# Patient Record
Sex: Female | Born: 1945 | Race: Black or African American | Hispanic: No | Smoking: Never smoker
Health system: Southern US, Community
[De-identification: ages and names within clinical notes are randomized; demographics above are authoritative.]

## PROBLEM LIST (undated history)

## (undated) DIAGNOSIS — I10 Essential (primary) hypertension: Secondary | ICD-10-CM

## (undated) DIAGNOSIS — M797 Fibromyalgia: Secondary | ICD-10-CM

## (undated) DIAGNOSIS — E119 Type 2 diabetes mellitus without complications: Secondary | ICD-10-CM

## (undated) HISTORY — DX: Essential (primary) hypertension: I10

## (undated) HISTORY — DX: Fibromyalgia: M79.7

## (undated) HISTORY — DX: Type 2 diabetes mellitus without complications: E11.9

---

## 1998-01-17 ENCOUNTER — Ambulatory Visit (HOSPITAL_COMMUNITY): Admission: RE | Admit: 1998-01-17 | Discharge: 1998-01-17 | Payer: Self-pay | Admitting: Rheumatology

## 1998-05-02 ENCOUNTER — Encounter: Payer: Self-pay | Admitting: Endocrinology

## 1998-05-02 ENCOUNTER — Ambulatory Visit (HOSPITAL_COMMUNITY): Admission: RE | Admit: 1998-05-02 | Discharge: 1998-05-02 | Payer: Self-pay | Admitting: Endocrinology

## 1999-06-18 ENCOUNTER — Other Ambulatory Visit: Admission: RE | Admit: 1999-06-18 | Discharge: 1999-06-18 | Payer: Self-pay | Admitting: *Deleted

## 2000-08-16 ENCOUNTER — Other Ambulatory Visit: Admission: RE | Admit: 2000-08-16 | Discharge: 2000-08-16 | Payer: Self-pay | Admitting: *Deleted

## 2000-08-17 ENCOUNTER — Encounter: Payer: Self-pay | Admitting: Emergency Medicine

## 2000-08-17 ENCOUNTER — Emergency Department (HOSPITAL_COMMUNITY): Admission: EM | Admit: 2000-08-17 | Discharge: 2000-08-17 | Payer: Self-pay | Admitting: Emergency Medicine

## 2000-09-02 ENCOUNTER — Encounter: Admission: RE | Admit: 2000-09-02 | Discharge: 2000-10-03 | Payer: Self-pay | Admitting: Rheumatology

## 2001-09-07 ENCOUNTER — Ambulatory Visit (HOSPITAL_COMMUNITY): Admission: RE | Admit: 2001-09-07 | Discharge: 2001-09-07 | Payer: Self-pay | Admitting: Endocrinology

## 2001-09-07 ENCOUNTER — Encounter: Payer: Self-pay | Admitting: Endocrinology

## 2003-07-01 ENCOUNTER — Emergency Department (HOSPITAL_COMMUNITY): Admission: EM | Admit: 2003-07-01 | Discharge: 2003-07-02 | Payer: Self-pay | Admitting: Emergency Medicine

## 2003-07-18 ENCOUNTER — Ambulatory Visit (HOSPITAL_COMMUNITY): Admission: RE | Admit: 2003-07-18 | Discharge: 2003-07-18 | Payer: Self-pay | Admitting: Internal Medicine

## 2003-08-14 ENCOUNTER — Encounter: Admission: RE | Admit: 2003-08-14 | Discharge: 2003-11-12 | Payer: Self-pay | Admitting: Neurological Surgery

## 2003-10-30 ENCOUNTER — Other Ambulatory Visit: Admission: RE | Admit: 2003-10-30 | Discharge: 2003-10-30 | Payer: Self-pay | Admitting: Obstetrics & Gynecology

## 2004-07-27 ENCOUNTER — Ambulatory Visit: Payer: Self-pay | Admitting: Internal Medicine

## 2004-07-29 ENCOUNTER — Ambulatory Visit: Payer: Self-pay | Admitting: Internal Medicine

## 2005-09-27 ENCOUNTER — Ambulatory Visit: Payer: Self-pay | Admitting: Internal Medicine

## 2005-10-13 ENCOUNTER — Ambulatory Visit: Payer: Self-pay | Admitting: Internal Medicine

## 2005-10-21 ENCOUNTER — Ambulatory Visit: Payer: Self-pay | Admitting: Internal Medicine

## 2005-12-30 ENCOUNTER — Ambulatory Visit: Payer: Self-pay | Admitting: Internal Medicine

## 2006-04-08 ENCOUNTER — Encounter
Admission: RE | Admit: 2006-04-08 | Discharge: 2006-07-07 | Payer: Self-pay | Admitting: Physical Medicine & Rehabilitation

## 2006-04-08 ENCOUNTER — Ambulatory Visit: Payer: Self-pay | Admitting: Physical Medicine & Rehabilitation

## 2006-04-12 ENCOUNTER — Ambulatory Visit: Payer: Self-pay | Admitting: Internal Medicine

## 2006-04-18 ENCOUNTER — Encounter
Admission: RE | Admit: 2006-04-18 | Discharge: 2006-07-07 | Payer: Self-pay | Admitting: Physical Medicine and Rehabilitation

## 2006-05-13 ENCOUNTER — Ambulatory Visit: Payer: Self-pay | Admitting: Internal Medicine

## 2006-06-03 ENCOUNTER — Ambulatory Visit: Payer: Self-pay | Admitting: Physical Medicine & Rehabilitation

## 2006-07-08 ENCOUNTER — Ambulatory Visit: Payer: Self-pay | Admitting: Physical Medicine & Rehabilitation

## 2006-07-08 ENCOUNTER — Encounter
Admission: RE | Admit: 2006-07-08 | Discharge: 2006-10-06 | Payer: Self-pay | Admitting: Physical Medicine & Rehabilitation

## 2006-08-25 ENCOUNTER — Ambulatory Visit: Payer: Self-pay | Admitting: Internal Medicine

## 2006-09-01 ENCOUNTER — Ambulatory Visit: Payer: Self-pay | Admitting: Physical Medicine & Rehabilitation

## 2006-09-20 ENCOUNTER — Encounter (INDEPENDENT_AMBULATORY_CARE_PROVIDER_SITE_OTHER): Payer: Self-pay | Admitting: *Deleted

## 2006-09-20 ENCOUNTER — Other Ambulatory Visit: Admission: RE | Admit: 2006-09-20 | Discharge: 2006-09-20 | Payer: Self-pay | Admitting: Internal Medicine

## 2006-09-20 ENCOUNTER — Ambulatory Visit: Payer: Self-pay | Admitting: Internal Medicine

## 2006-11-22 ENCOUNTER — Encounter
Admission: RE | Admit: 2006-11-22 | Discharge: 2007-02-20 | Payer: Self-pay | Admitting: Physical Medicine & Rehabilitation

## 2006-11-22 ENCOUNTER — Ambulatory Visit: Payer: Self-pay | Admitting: Physical Medicine & Rehabilitation

## 2007-01-11 ENCOUNTER — Ambulatory Visit: Payer: Self-pay | Admitting: Internal Medicine

## 2007-01-18 ENCOUNTER — Ambulatory Visit: Payer: Self-pay | Admitting: Internal Medicine

## 2007-02-08 ENCOUNTER — Encounter: Payer: Self-pay | Admitting: Internal Medicine

## 2007-02-08 ENCOUNTER — Ambulatory Visit: Payer: Self-pay | Admitting: Internal Medicine

## 2007-02-23 ENCOUNTER — Encounter
Admission: RE | Admit: 2007-02-23 | Discharge: 2007-05-08 | Payer: Self-pay | Admitting: Physical Medicine & Rehabilitation

## 2007-05-02 ENCOUNTER — Ambulatory Visit: Payer: Self-pay | Admitting: Physical Medicine & Rehabilitation

## 2007-11-22 ENCOUNTER — Ambulatory Visit (HOSPITAL_COMMUNITY): Admission: RE | Admit: 2007-11-22 | Discharge: 2007-11-22 | Payer: Self-pay | Admitting: Obstetrics & Gynecology

## 2008-04-29 ENCOUNTER — Ambulatory Visit: Payer: Self-pay | Admitting: *Deleted

## 2008-04-29 ENCOUNTER — Ambulatory Visit: Payer: Self-pay | Admitting: Internal Medicine

## 2008-05-23 ENCOUNTER — Ambulatory Visit: Payer: Self-pay | Admitting: Internal Medicine

## 2008-11-09 ENCOUNTER — Emergency Department (HOSPITAL_COMMUNITY): Admission: EM | Admit: 2008-11-09 | Discharge: 2008-11-09 | Payer: Self-pay | Admitting: Emergency Medicine

## 2010-06-28 ENCOUNTER — Encounter: Payer: Self-pay | Admitting: Family Medicine

## 2010-10-20 NOTE — Assessment & Plan Note (Signed)
Joanna Wolf is back regarding her fibromyalgia.  Her fibro has been under  fair control as has her shoulders, but her knees have really been acting  up lately.  She has worked on exercise and weight loss but really has  not made a lot of gains as far as her weight is concerned.  Her knees  are bothering her with walking and standing and particularly with stair-  climbing.  Pain is more medially in both knees.  She describes it as  sharp aching and constant.   The patient remains on Lyrica 150 mg t.i.d., Cymbalta 60 mg daily.  Pain  interferes with general activity, relations with others, and quality of  life on a moderate to severe level.   REVIEW OF SYSTEMS:  The patient reports constipation, occasional  abdominal pain.  Other pertinent positives listed above, and full review  is in the health and history section of the chart.   SOCIAL HISTORY:  The patient is divorced and living alone.  She is going  to stay with her mother over the summer.   PHYSICAL EXAMINATION:  VITAL SIGNS:  Blood pressure is 127/80, pulse 58,  respiratory rate 18.  She is saturating 97% on room air.  GENERAL:  The patient is pleasant, in no acute distress.  She is alert  and oriented x3.  She remains obese.  She is antalgic in both legs.  She  has bilateral valgus deformities in both knees.  Her shoulder range of  motion is fair.  She has tenderness in the neck and shoulders, back and  hips once again today.  Cognitively, she is intact.  HEART:  Regular.  LUNGS:  Clear.  ABDOMEN:  Soft, nontender.  EXTREMITIES:  Both knees are notable for some peripatellar swelling and  amount of crepitus with range of motion.  Meniscal maneuvers were  positive in both knees.   ASSESSMENT:  1. Fibromyalgia.  2. Cervical spondylosis with degenerative disk disease at C4-C5.  3. Obesity.  4. Left rotator cuff syndrome.  5. 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  6. Insulin-requiring diabetes.  7. Osteoarthritis of  bilateral knees.   PLAN:  1. After informed consent, we injected via the medial approach both      knees with 40 mg of Kenalog and 3 mL of 1% lidocaine.  The patient      tolerated without difficulty.  We discussed temporary increases in      her CBGs which she will need to be aware of.  2. Encouraged exercise and weight loss.  She may benefit from knee      bracing.  Consider Synvisc in the future.  3. Continue medications as above.  Recommend DHEA supplementation.  4. I will see her back in about 3 months' time if not sooner.      Ranelle Oyster, M.D.  Electronically Signed     ZTS/MedQ  D:  11/28/2006 10:20:44  T:  11/28/2006 11:45:59  Job #:  [id number]   cc:   Tresa Endo L. Philipp Deputy, M.D.  Fax: [phone]

## 2010-10-20 NOTE — Assessment & Plan Note (Signed)
Joanna Wolf is back regarding her fibromyalgia and knee pain. We injected her  knees in June and she had good results for 2-3 months. She has been  trying to focus on her weight loss recently, but still has not made a  lot of headway there. She rates her pain a 9/10 and has diffuse pain,  but pain focuses more so on the knees. She describes the pain as  stabbing, aching and sharp. The pain is associated with general  activities, relations with others and enjoyment of life on a moderate to  severe level.   REVIEW OF SYSTEMS:  Is notable for trouble walking, bowel control  problems, night sweats, constipation. Other pertinent positives listed  above and full review is in the written Health and History section of  the chart.   SOCIAL HISTORY:  The patient is divorced and living alone.   PHYSICAL EXAMINATION:  Blood pressure 135/77, pulse 79, respiratory rate  is 18. She is satting 98% on room air. The patient is generally  pleasant; alert and oriented x3. She remains obese. She has been  bilateral valgus deformities at the knees. She is antalgic with her gait  bilaterally. She has fair shoulder range of motion. She has multiple  tender points. Knees are generally more tender particularly at the  medial aspects with palpation. She had no obvious crepitus today. No  ligamentous laxity was noted.  HEART: Regular.  CHEST: Clear.  Meniscal maneuvers were positive in both knees.   ASSESSMENT:  1. Fibromyalgia.  2. Cervical spondylosis and degenerative disc disease particularly at      C4-C5.  3. Obesity.  4. Left rotator cuff syndrome.  5. Bilateral osteoarthritis of the knees.  6. Myofascial dysfunction of neck and shoulders.  7. Diabetes.   PLAN:  1. After informed consent, we injected both knees via the lateral      approach this time with 40 mg of Kenalog and 3 cc of 1% lidocaine.      The patient tolerated well.  2. Recommended over-the-counter glucosamine with chondroitin.  3.  Continue exercise and weight loss attempts.  4. Continue Cymbalta and Lyrica as prescribed.  5. I will see her back in three months.      Ranelle Oyster, M.D.  Electronically Signed     ZTS/MedQ  D:  05/08/2007 15:46:14  T:  05/08/2007 20:01:27  Job #:  [id number]   cc:   Tresa Endo L. Philipp Deputy, M.D.  Fax: [phone]

## 2014-04-23 ENCOUNTER — Ambulatory Visit (INDEPENDENT_AMBULATORY_CARE_PROVIDER_SITE_OTHER): Payer: PRIVATE HEALTH INSURANCE | Admitting: Family Medicine

## 2014-04-23 VITALS — BP 146/72 | HR 114 | Temp 98.4°F | Resp 20 | Ht 60.0 in | Wt 226.8 lb

## 2014-04-23 DIAGNOSIS — E119 Type 2 diabetes mellitus without complications: Secondary | ICD-10-CM

## 2014-04-23 DIAGNOSIS — L500 Allergic urticaria: Secondary | ICD-10-CM

## 2014-04-23 DIAGNOSIS — T783XXA Angioneurotic edema, initial encounter: Secondary | ICD-10-CM

## 2014-04-23 MED ORDER — RANITIDINE HCL 150 MG PO TABS
150.0000 mg | ORAL_TABLET | Freq: Two times a day (BID) | ORAL | Status: AC
  Administered 2014-04-23: 150 mg via ORAL

## 2014-04-23 MED ORDER — METHYLPREDNISOLONE SODIUM SUCC 125 MG IJ SOLR
125.0000 mg | Freq: Once | INTRAMUSCULAR | Status: AC
  Administered 2014-04-23: 125 mg via INTRAMUSCULAR

## 2014-04-23 MED ORDER — DIPHENHYDRAMINE HCL 50 MG/ML IJ SOLN
25.0000 mg | Freq: Once | INTRAMUSCULAR | Status: AC
  Administered 2014-04-23: 25 mg via INTRAMUSCULAR

## 2014-04-23 MED ORDER — PREDNISONE 10 MG PO TABS: ORAL_TABLET | ORAL | Status: AC

## 2014-04-23 MED ORDER — CETIRIZINE HCL 10 MG PO TABS
5.0000 mg | ORAL_TABLET | Freq: Every day | ORAL | Status: AC
  Administered 2014-04-23: 5 mg via ORAL

## 2014-04-23 NOTE — Addendum Note (Signed)
Addended by: Alonnah Lampkins H on: 04/23/2014 09:24 PM   Modules accepted: Level of Service

## 2014-04-23 NOTE — Progress Notes (Signed)
Subjective: 68 year old lady who is here complaining of itching. She went to her exercise class early today and worked out. She got sweaty. She started itching all over. She said it calmed down a little bit there came back worse with breaking out with hives on her arms and legs and trunk. She feels like her lip is a little swollen. The tongue feels a little tingly. No shortness of breath. Has never had hives before. She is diabetic but her sugars run good on the medication, in the low 100 range.  Objective: Significant urticaria on her extremities, less on her trunk. She has a little swelling of the left lower lip. The tongue appears normal as does the throat. Neck supple without nodes. Chest clear. Heart regular without murmurs.  Assessment: Urticaria and early angioedema diabetes  Plan: Solu-Medrol 125 mg IM Zyrtec1 now Ranitidine150 mg now Benadryl 25 mg IM Detailed instructions written out and discussed with her son. We do not know the cause of this. She is to go to the emergency room if in all worse, or return here tomorrow for can be of assistance.

## 2014-04-23 NOTE — Patient Instructions (Addendum)
You will have received an injection of 125 mg of Solumedrol and Benadryl 25 mg.  If you are getting at all worse over next few hours, especially if lips or tongue are getting worse, GO TO THE EMERGENCY ROOM OR CALL 911  Starting tomorrow Take Prednisone as directed  Take ranitidine (Zantac) 150 mg twice daily  Take cetirizine (Zyrtec) one pill once or twice daily for allergy and itching  If worse at anytime either return here or go to the emergency room if necessary during hours that we are closed  Your sugar will go up with the Solumedrol injection. Be careful on avoiding sugars and starches (potatoes, rice, bread, pasta)Patient is asked to monitor BP at home or work, several times per month and return with written values at next office visit. Your blood sugars. They will go up for a few days.  Hives Hives are itchy, red, swollen areas of the skin. They can vary in size and location on your body. Hives can come and go for hours or several days (acute hives) or for several weeks (chronic hives). Hives do not spread from person to person (noncontagious). They may get worse with scratching, exercise, and emotional stress. CAUSES   Allergic reaction to food, additives, or drugs.  Infections, including the common cold.  Illness, such as vasculitis, lupus, or thyroid disease.  Exposure to sunlight, heat, or cold.  Exercise.  Stress.  Contact with chemicals. SYMPTOMS   Red or white swollen patches on the skin. The patches may change size, shape, and location quickly and repeatedly.  Itching.  Swelling of the hands, feet, and face. This may occur if hives develop deeper in the skin. DIAGNOSIS  Your caregiver can usually tell what is wrong by performing a physical exam. Skin or blood tests may also be done to determine the cause of your hives. In some cases, the cause cannot be determined. TREATMENT  Mild cases usually get better with medicines such as antihistamines. Severe cases  may require an emergency epinephrine injection. If the cause of your hives is known, treatment includes avoiding that trigger.  HOME CARE INSTRUCTIONS   Avoid causes that trigger your hives.  Take antihistamines as directed by your caregiver to reduce the severity of your hives. Non-sedating or low-sedating antihistamines are usually recommended. Do not drive while taking an antihistamine.  Take any other medicines prescribed for itching as directed by your caregiver.  Wear loose-fitting clothing.  Keep all follow-up appointments as directed by your caregiver. SEEK MEDICAL CARE IF:   You have persistent or severe itching that is not relieved with medicine.  You have painful or swollen joints. SEEK IMMEDIATE MEDICAL CARE IF:   You have a fever.  Your tongue or lips are swollen.  You have trouble breathing or swallowing.  You feel tightness in the throat or chest.  You have abdominal pain. These problems may be the first sign of a life-threatening allergic reaction. Call your local emergency services (911 in [place] MAKE SURE YOU:   Understand these instructions.  Will watch your condition.  Will get help right away if you are not doing well or get worse. Document Released: 05/24/2005 Document Revised: 05/29/2013 Document Reviewed: 08/17/2011 [facility] Patient Information 2015 [employer], [place]. This information is not intended to replace advice given to you by your health care provider. Make sure you discuss any questions you have with your health care provider.

## 2016-03-26 ENCOUNTER — Other Ambulatory Visit: Payer: Self-pay | Admitting: Family Medicine

## 2016-03-26 DIAGNOSIS — R1012 Left upper quadrant pain: Secondary | ICD-10-CM

## 2016-04-07 ENCOUNTER — Inpatient Hospital Stay: Admission: RE | Admit: 2016-04-07 | Payer: Self-pay | Source: Ambulatory Visit

## 2016-05-19 ENCOUNTER — Other Ambulatory Visit: Payer: Self-pay

## 2016-05-19 ENCOUNTER — Other Ambulatory Visit: Payer: Self-pay | Admitting: Family Medicine

## 2016-05-19 DIAGNOSIS — R1012 Left upper quadrant pain: Secondary | ICD-10-CM

## 2016-05-24 ENCOUNTER — Inpatient Hospital Stay: Admission: RE | Admit: 2016-05-24 | Payer: Self-pay | Source: Ambulatory Visit

## 2020-05-13 ENCOUNTER — Ambulatory Visit: Payer: Medicare Other | Admitting: Podiatry

## 2020-07-22 DIAGNOSIS — E785 Hyperlipidemia, unspecified: Secondary | ICD-10-CM | POA: Diagnosis not present

## 2020-07-22 DIAGNOSIS — K219 Gastro-esophageal reflux disease without esophagitis: Secondary | ICD-10-CM | POA: Diagnosis not present

## 2020-07-22 DIAGNOSIS — E119 Type 2 diabetes mellitus without complications: Secondary | ICD-10-CM | POA: Diagnosis not present

## 2020-07-22 DIAGNOSIS — D582 Other hemoglobinopathies: Secondary | ICD-10-CM | POA: Diagnosis not present

## 2020-07-22 DIAGNOSIS — I10 Essential (primary) hypertension: Secondary | ICD-10-CM | POA: Diagnosis not present

## 2020-07-22 DIAGNOSIS — E1165 Type 2 diabetes mellitus with hyperglycemia: Secondary | ICD-10-CM | POA: Diagnosis not present

## 2020-07-22 DIAGNOSIS — M159 Polyosteoarthritis, unspecified: Secondary | ICD-10-CM | POA: Diagnosis not present

## 2020-07-24 DIAGNOSIS — E1165 Type 2 diabetes mellitus with hyperglycemia: Secondary | ICD-10-CM | POA: Diagnosis not present

## 2020-07-24 DIAGNOSIS — D582 Other hemoglobinopathies: Secondary | ICD-10-CM | POA: Diagnosis not present

## 2020-07-24 DIAGNOSIS — Z794 Long term (current) use of insulin: Secondary | ICD-10-CM | POA: Diagnosis not present

## 2020-07-24 DIAGNOSIS — I10 Essential (primary) hypertension: Secondary | ICD-10-CM | POA: Diagnosis not present

## 2020-09-01 DIAGNOSIS — I10 Essential (primary) hypertension: Secondary | ICD-10-CM | POA: Diagnosis not present

## 2020-09-01 DIAGNOSIS — M159 Polyosteoarthritis, unspecified: Secondary | ICD-10-CM | POA: Diagnosis not present

## 2020-09-01 DIAGNOSIS — K219 Gastro-esophageal reflux disease without esophagitis: Secondary | ICD-10-CM | POA: Diagnosis not present

## 2020-09-01 DIAGNOSIS — D582 Other hemoglobinopathies: Secondary | ICD-10-CM | POA: Diagnosis not present

## 2020-09-01 DIAGNOSIS — E119 Type 2 diabetes mellitus without complications: Secondary | ICD-10-CM | POA: Diagnosis not present

## 2020-09-01 DIAGNOSIS — E1165 Type 2 diabetes mellitus with hyperglycemia: Secondary | ICD-10-CM | POA: Diagnosis not present

## 2020-09-01 DIAGNOSIS — E785 Hyperlipidemia, unspecified: Secondary | ICD-10-CM | POA: Diagnosis not present

## 2020-09-19 DIAGNOSIS — K219 Gastro-esophageal reflux disease without esophagitis: Secondary | ICD-10-CM | POA: Diagnosis not present

## 2020-09-19 DIAGNOSIS — M159 Polyosteoarthritis, unspecified: Secondary | ICD-10-CM | POA: Diagnosis not present

## 2020-09-19 DIAGNOSIS — E119 Type 2 diabetes mellitus without complications: Secondary | ICD-10-CM | POA: Diagnosis not present

## 2020-09-19 DIAGNOSIS — I10 Essential (primary) hypertension: Secondary | ICD-10-CM | POA: Diagnosis not present

## 2020-09-19 DIAGNOSIS — D582 Other hemoglobinopathies: Secondary | ICD-10-CM | POA: Diagnosis not present

## 2020-09-19 DIAGNOSIS — E785 Hyperlipidemia, unspecified: Secondary | ICD-10-CM | POA: Diagnosis not present

## 2020-09-19 DIAGNOSIS — E1165 Type 2 diabetes mellitus with hyperglycemia: Secondary | ICD-10-CM | POA: Diagnosis not present

## 2020-09-24 DIAGNOSIS — R319 Hematuria, unspecified: Secondary | ICD-10-CM | POA: Diagnosis not present

## 2020-10-11 ENCOUNTER — Encounter (HOSPITAL_COMMUNITY): Payer: Self-pay

## 2020-10-11 ENCOUNTER — Emergency Department (HOSPITAL_COMMUNITY)
Admission: EM | Admit: 2020-10-11 | Discharge: 2020-10-11 | Disposition: A | Discharge status: Home / Self Care | Payer: Medicare Other | Source: Home / Self Care | Attending: Emergency Medicine | Admitting: Emergency Medicine

## 2020-10-11 ENCOUNTER — Emergency Department (HOSPITAL_COMMUNITY)
Admission: EM | Admit: 2020-10-11 | Discharge: 2020-10-11 | Disposition: A | Discharge status: Home / Self Care | Payer: Medicare Other | Attending: Emergency Medicine | Admitting: Emergency Medicine

## 2020-10-11 ENCOUNTER — Emergency Department (HOSPITAL_COMMUNITY): Payer: Medicare Other

## 2020-10-11 ENCOUNTER — Other Ambulatory Visit: Payer: Self-pay

## 2020-10-11 DIAGNOSIS — R609 Edema, unspecified: Secondary | ICD-10-CM | POA: Insufficient documentation

## 2020-10-11 DIAGNOSIS — Z79899 Other long term (current) drug therapy: Secondary | ICD-10-CM | POA: Insufficient documentation

## 2020-10-11 DIAGNOSIS — Z7984 Long term (current) use of oral hypoglycemic drugs: Secondary | ICD-10-CM | POA: Insufficient documentation

## 2020-10-11 DIAGNOSIS — R9389 Abnormal findings on diagnostic imaging of other specified body structures: Secondary | ICD-10-CM | POA: Diagnosis not present

## 2020-10-11 DIAGNOSIS — N950 Postmenopausal bleeding: Secondary | ICD-10-CM | POA: Insufficient documentation

## 2020-10-11 DIAGNOSIS — I10 Essential (primary) hypertension: Secondary | ICD-10-CM

## 2020-10-11 DIAGNOSIS — E119 Type 2 diabetes mellitus without complications: Secondary | ICD-10-CM | POA: Insufficient documentation

## 2020-10-11 DIAGNOSIS — E1165 Type 2 diabetes mellitus with hyperglycemia: Secondary | ICD-10-CM | POA: Insufficient documentation

## 2020-10-11 DIAGNOSIS — N939 Abnormal uterine and vaginal bleeding, unspecified: Secondary | ICD-10-CM | POA: Insufficient documentation

## 2020-10-11 DIAGNOSIS — R739 Hyperglycemia, unspecified: Secondary | ICD-10-CM

## 2020-10-11 LAB — COMPREHENSIVE METABOLIC PANEL
ALT: 21 U/L (ref 0–44)
AST: 27 U/L (ref 15–41)
Albumin: 4 g/dL (ref 3.5–5.0)
Alkaline Phosphatase: 67 U/L (ref 38–126)
Anion gap: 13 (ref 5–15)
BUN: 12 mg/dL (ref 8–23)
CO2: 23 mmol/L (ref 22–32)
Calcium: 8.9 mg/dL (ref 8.9–10.3)
Chloride: 101 mmol/L (ref 98–111)
Creatinine, Ser: 0.94 mg/dL (ref 0.44–1.00)
GFR, Estimated: >60 mL/min (ref >60)
Glucose, Bld: 279 mg/dL — ABNORMAL HIGH (ref 70–99)
Potassium: 3.5 mmol/L (ref 3.5–5.1)
Sodium: 137 mmol/L (ref 135–145)
Total Bilirubin: 0.7 mg/dL (ref 0.3–1.2)
Total Protein: 7.4 g/dL (ref 6.5–8.1)

## 2020-10-11 LAB — CBC WITH DIFFERENTIAL/PLATELET
Abs Immature Granulocytes: 0.02 10*3/uL (ref 0.00–0.07)
Basophils Absolute: 0 10*3/uL (ref 0.0–0.1)
Basophils Relative: 0 %
Eosinophils Absolute: 0 10*3/uL (ref 0.0–0.5)
Eosinophils Relative: 0 %
HCT: 37 % (ref 36.0–46.0)
Hemoglobin: 12.2 g/dL (ref 12.0–15.0)
Immature Granulocytes: 0 %
Lymphocytes Relative: 21 %
Lymphs Abs: 1.7 10*3/uL (ref 0.7–4.0)
MCH: 24.7 pg — ABNORMAL LOW (ref 26.0–34.0)
MCHC: 33 g/dL (ref 30.0–36.0)
MCV: 75.1 fL — ABNORMAL LOW (ref 80.0–100.0)
Monocytes Absolute: 0.6 10*3/uL (ref 0.1–1.0)
Monocytes Relative: 7 %
Neutro Abs: 6 10*3/uL (ref 1.7–7.7)
Neutrophils Relative %: 72 %
Platelets: 215 10*3/uL (ref 150–400)
RBC: 4.93 MIL/uL (ref 3.87–5.11)
RDW: 15.9 % — ABNORMAL HIGH (ref 11.5–15.5)
WBC: 8.4 10*3/uL (ref 4.0–10.5)
nRBC: 0 % (ref 0.0–0.2)

## 2020-10-11 LAB — URINALYSIS, ROUTINE W REFLEX MICROSCOPIC
Bilirubin Urine: NEGATIVE
Glucose, UA: 150 mg/dL — AB
Ketones, ur: 5 mg/dL — AB
Leukocytes,Ua: NEGATIVE
Nitrite: NEGATIVE
Protein, ur: 30 mg/dL — AB
RBC / HPF: >50 RBC/hpf — ABNORMAL HIGH (ref 0–5)
Specific Gravity, Urine: 1.011 (ref 1.005–1.030)
pH: 7 (ref 5.0–8.0)

## 2020-10-11 LAB — CBC
HCT: 36.2 % (ref 36.0–46.0)
Hemoglobin: 11.8 g/dL — ABNORMAL LOW (ref 12.0–15.0)
MCH: 24.7 pg — ABNORMAL LOW (ref 26.0–34.0)
MCHC: 32.6 g/dL (ref 30.0–36.0)
MCV: 75.9 fL — ABNORMAL LOW (ref 80.0–100.0)
Platelets: 217 10*3/uL (ref 150–400)
RBC: 4.77 MIL/uL (ref 3.87–5.11)
RDW: 15.8 % — ABNORMAL HIGH (ref 11.5–15.5)
WBC: 9.8 10*3/uL (ref 4.0–10.5)
nRBC: 0 % (ref 0.0–0.2)

## 2020-10-11 LAB — WET PREP, GENITAL
Clue Cells Wet Prep HPF POC: NONE SEEN
Sperm: NONE SEEN
Trich, Wet Prep: NONE SEEN
Yeast Wet Prep HPF POC: NONE SEEN

## 2020-10-11 LAB — PROTIME-INR
INR: 1 (ref 0.8–1.2)
Prothrombin Time: 12.8 seconds (ref 11.4–15.2)

## 2020-10-11 LAB — CBG MONITORING, ED: Glucose-Capillary: 261 mg/dL — ABNORMAL HIGH (ref 70–99)

## 2020-10-11 MED ORDER — MEGESTROL ACETATE 40 MG PO TABS: 40.0000 mg | ORAL_TABLET | Freq: Two times a day (BID) | ORAL | 0 refills | Status: AC

## 2020-10-11 NOTE — Discharge Instructions (Signed)
Thank you for allowing [place] to take care of you today, we hope you feel better soon.  Please call the women's outpatient clinic to schedule very close follow-up. Start taking Megace 40 mg twice daily to help with your vaginal bleeding. You need to return to the emergency department if you have severe symptoms of your anemia such as lightheadedness, passout, chest pain, or shortness of breath.  Also come back for any other emergent medical conditions.  Thank you again, Dr. Marylouise Stacks

## 2020-10-11 NOTE — ED Notes (Addendum)
Patients son dropped off insulin  medicine to patient  RN notified and will instruct patient when and if to take

## 2020-10-11 NOTE — ED Notes (Signed)
Family at bedside. 

## 2020-10-11 NOTE — ED Notes (Signed)
Patient appears in no acute distress at this time. Patient does have dried blood on right leg and sock from earlier clotting. Denies pain, cramping or any other symptoms. Reports that she had to change her depends 4 times today due to excessive blood.

## 2020-10-11 NOTE — ED Provider Notes (Signed)
[facility] EMERGENCY DEPARTMENT Provider Note   CSN: [id number] Arrival date & time: 10/11/20  1908     History Chief Complaint  Patient presents with  . Vaginal Bleeding    Joanna Wolf is a 75 y.o. female.  HPI 75 year old G5, P5 female with history of diabetes, hypertension, and hyperlipidemia presents emergency department for vaginal bleeding.  She states that for the last week she had blood in her urine.  Went to urgent care a few days ago and was started on an antibiotic for presumed UTI.  States they called her back and said that she did not have a UTI so she stopped the antibiotic.  This morning, when she got out of bed.  She felt a "gush" and states that she had a large volume of blood and some blood clots on her bed and on the floor.  States she could tell it was vaginal at that time.  Went to [employer] where she had another episode of many clots being passed.  Denies feeling short of breath or lightheaded.  Does endorse occasional left suprapubic pain that is mild and dull.  No exacerbating or alleviating factors.  She has not taken anything for the vaginal bleeding.  Nothing makes it better, nothing makes it worse.  Denies history of vaginal bleeding recently.  States she last had her period 45 years ago after the birth of her daughter.  States she did have heavy bleeding after the birth, but received 3 gluteal shots which took away her periods altogether.  Denies history of prior gynecologic surgery.    Past Medical History:  Diagnosis Date  . Diabetes mellitus without complication (HCC)   . Fibromyalgia   . Hypertension     There are no problems to display for this patient.   History reviewed. No pertinent surgical history.   OB History   No obstetric history on file.     History reviewed. No pertinent family history.  Social History   Tobacco Use  . Smoking status: Never Smoker  . Smokeless tobacco: Never Used  Substance Use Topics  .  Alcohol use: No    Alcohol/week: 0.0 standard drinks  . Drug use: No    Home Medications Prior to Admission medications   Medication Sig Start Date End Date Taking? Authorizing Provider  megestrol (MEGACE) 40 MG tablet Take 1 tablet (40 mg total) by mouth 2 (two) times daily for 14 days. 10/11/20 10/25/20 Yes Paitlyn Mcclatchey, Penni Bombard, DO  Cholecalciferol (VITAMIN D-3) 5000 UNITS TABS Take 3 capsules by mouth daily.    [provider]  esomeprazole (NEXIUM) 40 MG capsule Take 40 mg by mouth daily at 12 noon.    [provider]  metFORMIN (GLUCOPHAGE-XR) 500 MG 24 hr tablet Take 500 mg by mouth 2 (two) times daily.    [provider]  NIFEdipine (PROCARDIA-XL/ADALAT-CC/NIFEDICAL-XL) 30 MG 24 hr tablet Take 30 mg by mouth daily.    [provider]  pravastatin (PRAVACHOL) 40 MG tablet Take 40 mg by mouth daily.    [provider]  predniSONE (DELTASONE) 10 MG tablet Take 3 daily for 2 days, then 2 daily for 2 days, then 1 daily for 2 days 04/23/14   Peyton Najjar, MD  valsartan (DIOVAN) 160 MG tablet Take 160 mg by mouth daily.    [provider]    Allergies    Patient has no known allergies.  Review of Systems   Review of Systems  Constitutional: Negative  for chills and fever.  HENT: Negative for ear pain and sore throat.   Eyes: Negative for pain and visual disturbance.  Respiratory: Negative for cough and shortness of breath.   Cardiovascular: Negative for chest pain and palpitations.  Gastrointestinal: Negative for abdominal pain and vomiting.  Genitourinary: Positive for pelvic pain and vaginal bleeding. Negative for dysuria and hematuria.  Musculoskeletal: Negative for arthralgias and back pain.  Skin: Negative for color change and rash.  Neurological: Negative for seizures and syncope.  All other systems reviewed and are negative.   Physical Exam Updated Vital Signs BP (!) 146/84   Pulse 91   Temp 98.5 F (36.9 C)   Resp  18   SpO2 98%   Physical Exam Vitals and nursing note reviewed.  Constitutional:      General: She is not in acute distress.    Appearance: She is well-developed.  HENT:     Head: Normocephalic and atraumatic.     Nose: Nose normal.     Mouth/Throat:     Mouth: Mucous membranes are moist.  Eyes:     Extraocular Movements: Extraocular movements intact.     Conjunctiva/sclera: Conjunctivae normal.  Cardiovascular:     Rate and Rhythm: Normal rate and regular rhythm.     Heart sounds: Normal heart sounds. No murmur heard.   Pulmonary:     Effort: Pulmonary effort is normal. No respiratory distress.     Breath sounds: Normal breath sounds.  Abdominal:     General: Abdomen is flat.     Palpations: Abdomen is soft.     Tenderness: There is no abdominal tenderness. There is no guarding or rebound.  Genitourinary:    Comments: Deferred per patient preference as she had a pelvic exam earlier today Musculoskeletal:        General: Normal range of motion.     Cervical back: Neck supple.  Skin:    General: Skin is warm and dry.     Capillary Refill: Capillary refill takes less than 2 seconds.  Neurological:     General: No focal deficit present.     Mental Status: She is alert and oriented to person, place, and time.  Psychiatric:        Mood and Affect: Mood normal.        Behavior: Behavior normal.     ED Results / Procedures / Treatments   Labs (all labs ordered are listed, but only abnormal results are displayed) Labs Reviewed  CBC - Abnormal; Notable for the following components:      Result Value   Hemoglobin 11.8 (*)    MCV 75.9 (*)    MCH 24.7 (*)    RDW 15.8 (*)    All other components within normal limits    EKG None  Radiology US PELVIC COMPLETE WITH TRANSVAGINAL  Result Date: 10/11/2020 CLINICAL DATA:  Vaginal bleeding. EXAM: TRANSABDOMINAL AND TRANSVAGINAL ULTRASOUND OF PELVIS TECHNIQUE: Both transabdominal and transvaginal ultrasound examinations of  the pelvis were performed. Transabdominal technique was performed for global imaging of the pelvis including uterus, ovaries, adnexal regions, and pelvic cul-de-sac. It was necessary to proceed with endovaginal exam following the transabdominal exam to visualize the endometrium. COMPARISON:  November 22, 2007 FINDINGS: Uterus Measurements: 15.4 cm x 5.8 cm x 6.3 cm = volume: 295.4 mL. No fibroids or other mass visualized. An ill-defined area of heterogeneous hypoechogenicity is seen within the region of the cervix (ultrasonographic measurements not provided). No abnormal flow is seen within this  area on color Doppler evaluation. This represents a new finding compared to the prior exam. Endometrium Thickness: 35.3 mm.  Heterogeneous in appearance Right ovary The right ovary is not clearly visualized. Left ovary The left ovary is not clearly visualized. Other findings A trace amount of pelvic free fluid is seen. It should be noted that the study is limited secondary to the patient's body habitus and patient discomfort. IMPRESSION: 1. Limited study without visualization of the right or left ovary. 2. Heterogeneous thickened endometrium. 3. Hypoechoic area within the cervix, as described above, which represents a new finding when compared to the prior study. MRI correlation is recommended, as a cervical mass cannot be excluded. Electronically Signed   By: Aram Candela M.D.   On: 10/11/2020 22:16    Procedures Procedures   Medications Ordered in ED Medications - No data to display  ED Course  I have reviewed the triage vital signs and the nursing notes.  Pertinent labs & imaging results that were available during my care of the patient were reviewed by me and considered in my medical decision making (see chart for details).    MDM Rules/Calculators/A&P                          75 year old female presenting to the emergency department for vaginal bleeding.  Vital signs are stable, patient is not in  acute distress.  States she has not had episode of bleeding since arriving to this emergency department, but did have another episode just prior to getting here.  Exam shows well-appearing female.  No abdominal tenderness.  Mucous membranes moist.  Heart regular rate and rhythm.  Lungs are clear to auscultation.  Differential for post menopausal vaginal bleeding includes malignancy, hormone disorder, fibroids, polyp, atrophy, cervicitis, endometritis, endometrial hyperplasia, hematuria  Discussed case with gynecology, Dr. Donavan Foil.  They recommend pelvic ultrasound.  Repeat CBC does show stable hemoglobin.  As patient is stable, they believe patient would be stable for outpatient management.  Recommend Megace 40 mg twice daily for uterine bleeding.  Ultrasound shows heterogeneous thickened endometrium and small spot on cervix.  Upon reevaluation, vital signs are still stable and patient is asymptomatic.  Reengaged gynecology, they confirm plan for starting Megace and close outpatient follow-up.  Patient is amenable to this plan.  We discussed symptomatic management and return precautions.  Megace prescription given.  Follow-up number for gynecology given as well.  Encouraged close follow-up, patient agrees to this.  Patient discharged in stable condition.  Final Clinical Impression(s) / ED Diagnoses Final diagnoses:  Vaginal bleeding    Rx / DC Orders ED Discharge Orders         Ordered    megestrol (MEGACE) 40 MG tablet  2 times daily        10/11/20 2250           Louretta Parma, DO 10/11/20 2317    Gerhard Munch, MD 10/11/20 [phone]

## 2020-10-11 NOTE — ED Triage Notes (Signed)
Complaints of vaginal bleeding for 2 weeks. Denies any pain Pt did not take blood pressure meds today

## 2020-10-11 NOTE — ED Provider Notes (Signed)
[facility]-EMERGENCY DEPT Provider Note   CSN: [id number] Arrival date & time: 10/11/20  9211     History No chief complaint on file.   Dalilah Curlin is a 75 y.o. female with a past medical history of diabetes, fibromyalgia, hypertension, who presents today for evaluation of vaginal bleeding. She states that about 2 weeks ago she had an episode of vaginal spotting.  She went to urgent care, they took a urine sample, gave her antibiotics, and then called her 2 days after saying that her culture was negative and she could stop the antibiotics.  She did not have any bleeding until today when she states she passed multiple large clots.  She has not had a hysterectomy.  She does not take blood thinning medications.  She denies any pain.  She denies any concern for tear.  She was not sexually active prior to the onset of the bleeding.  HPI     Past Medical History:  Diagnosis Date  . Diabetes mellitus without complication (HCC)   . Fibromyalgia   . Hypertension     There are no problems to display for this patient.   No past surgical history on file.   OB History   No obstetric history on file.     No family history on file.  Social History   Tobacco Use  . Smoking status: Never Smoker  . Smokeless tobacco: Never Used  Substance Use Topics  . Alcohol use: No    Alcohol/week: 0.0 standard drinks  . Drug use: No    Home Medications Prior to Admission medications   Medication Sig Start Date End Date Taking? Authorizing Provider  Cholecalciferol (VITAMIN D-3) 5000 UNITS TABS Take 3 capsules by mouth daily.    [provider]  esomeprazole (NEXIUM) 40 MG capsule Take 40 mg by mouth daily at 12 noon.    [provider]  metFORMIN (GLUCOPHAGE-XR) 500 MG 24 hr tablet Take 500 mg by mouth 2 (two) times daily.    [provider]  NIFEdipine (PROCARDIA-XL/ADALAT-CC/NIFEDICAL-XL) 30 MG 24 hr tablet Take 30 mg by mouth daily.     [provider]  pravastatin (PRAVACHOL) 40 MG tablet Take 40 mg by mouth daily.    [provider]  predniSONE (DELTASONE) 10 MG tablet Take 3 daily for 2 days, then 2 daily for 2 days, then 1 daily for 2 days 04/23/14   Peyton Najjar, MD  valsartan (DIOVAN) 160 MG tablet Take 160 mg by mouth daily.    [provider]    Allergies    Patient has no known allergies.  Review of Systems   Review of Systems  Constitutional: Negative for chills and fever.  HENT: Negative for congestion.   Eyes: Negative for visual disturbance.  Respiratory: Negative for chest tightness and shortness of breath.   Cardiovascular: Negative for chest pain.  Gastrointestinal: Negative for abdominal pain and nausea.  Genitourinary: Positive for vaginal bleeding. Negative for decreased urine volume, dysuria, frequency, hematuria, vaginal discharge and vaginal pain.  Musculoskeletal: Negative for back pain and neck pain.  Neurological: Negative for facial asymmetry and weakness.  All other systems reviewed and are negative.   Physical Exam Updated Vital Signs BP (!) 158/98   Pulse (!) 103   Temp 98.6 F (37 C) (Oral)   Resp 16   Ht 4\' 11"  (1.499 m)   Wt 117 kg   SpO2 95%   BMI 52.11 kg/m   Physical Exam Vitals and  nursing note reviewed. Exam conducted with a chaperone present (female ).  Constitutional:      General: She is not in acute distress.    Appearance: She is not diaphoretic.  HENT:     Head: Normocephalic and atraumatic.  Eyes:     General: No scleral icterus.       Right eye: No discharge.        Left eye: No discharge.     Conjunctiva/sclera: Conjunctivae normal.  Cardiovascular:     Rate and Rhythm: Normal rate and regular rhythm.  Pulmonary:     Effort: Pulmonary effort is normal. No respiratory distress.     Breath sounds: No stridor.  Abdominal:     General: There is no distension.  Genitourinary:    Comments: Normal external female genitalia.   There is a moderate amount of blood in the vaginal canal.  Due to patient discomfort and limitations in positioning based on bed and body habitus unable to clearly see her cervix.   Musculoskeletal:        General: No deformity.     Cervical back: Normal range of motion.     Right lower leg: Edema present.     Left lower leg: Edema present.  Skin:    General: Skin is warm and dry.  Neurological:     Mental Status: She is alert.     Motor: No abnormal muscle tone.  Psychiatric:        Behavior: Behavior normal.     ED Results / Procedures / Treatments   Labs (all labs ordered are listed, but only abnormal results are displayed) Labs Reviewed  WET PREP, GENITAL - Abnormal; Notable for the following components:      Result Value   WBC, Wet Prep HPF POC FEW (*)    All other components within normal limits  URINALYSIS, ROUTINE W REFLEX MICROSCOPIC - Abnormal; Notable for the following components:   APPearance HAZY (*)    Glucose, UA 150 (*)    Hgb urine dipstick LARGE (*)    Ketones, ur 5 (*)    Protein, ur 30 (*)    RBC / HPF >50 (*)    Bacteria, UA RARE (*)    All other components within normal limits  CBC WITH DIFFERENTIAL/PLATELET - Abnormal; Notable for the following components:   MCV 75.1 (*)    MCH 24.7 (*)    RDW 15.9 (*)    All other components within normal limits  COMPREHENSIVE METABOLIC PANEL - Abnormal; Notable for the following components:   Glucose, Bld 279 (*)    All other components within normal limits  CBG MONITORING, ED - Abnormal; Notable for the following components:   Glucose-Capillary 261 (*)    All other components within normal limits  PROTIME-INR  GC/CHLAMYDIA PROBE AMP ([place]) NOT AT [facility]    EKG None  Radiology No results found.  Procedures Procedures   Medications Ordered in ED Medications - No data to display  ED Course  I have reviewed the triage vital signs and the nursing notes.  Pertinent labs & imaging results that  were available during my care of the patient were reviewed by me and considered in my medical decision making (see chart for details).    MDM Rules/Calculators/A&P                         Patient is a 75 year old woman who presents today for evaluation of postmenopausal  vaginal bleeding.  On exam she has blood in the vaginal canal.  Due to body habitus and bed limitations in positioning unable to clearly view her cervix.  Labs are obtained and reviewed, she does not have significant anemia.  CMP shows hyperglycemia, she took her diabetes medicines and antihypertensives while in the emergency room. She was significantly hypertensive however after she took her home meds this improved.  We discussed the importance of maintaining compliance with all of her home meds and she states her understanding.  Urine did have over 50 red cells, she is not having specific urinary symptoms and this appears to be contamination from her vaginal bleeding.  We discussed that with any case of postmenopausal bleeding it is important to rule out malignant causes including endometrial/cervical cancer.  I discussed with patient and her daughter the need to follow-up with OB/GYN. While in the ER patient did have a episode where she had passage of multiple large clots. Given that she had blood in the vaginal canal I am not surprised by this. She is not anticoagulated and was not persistently tachycardic with normal hemoglobin.    Patient will be discharged home.  Recommended OB/GYN follow-up. If she requires additional emergency medical care for this issue I recommended that she go to [facility] where her OB/GYN would be available for consult if needed.  Return precautions were discussed with patient who states their understanding.  At the time of discharge patient denied any unaddressed complaints or concerns.  Patient is agreeable for discharge home.  Note: Portions of this report may have been transcribed using voice  recognition software. Every effort was made to ensure accuracy; however, inadvertent computerized transcription errors may be present  Final Clinical Impression(s) / ED Diagnoses Final diagnoses:  Post-menopausal bleeding  Hypertension, unspecified type  Hyperglycemia    Rx / DC Orders ED Discharge Orders         Ordered    Ambulatory referral to Obstetrics / Gynecology        10/11/20 1216           Cristina Gong, [place] 10/11/20 1636    Margarita Grizzle, MD 10/11/20 1755

## 2020-10-11 NOTE — Discharge Instructions (Signed)
As we discussed today it is very important for you to follow-up with OB/GYN for further evaluation of your bleeding.  Please do this even if your bleeding stops. If you develop fevers, feel lightheaded, are passing more than a cup's worth of blood clots in 12 hours or have other concerns please seek additional medical care and evaluation.

## 2020-10-11 NOTE — ED Triage Notes (Signed)
Patient reports vaginal bleeding x 2 weeks, was seen at [facility] today for same, states the bleeding got worse today and she was told to come here if it got worse so she could be seen by her OB/GYN

## 2020-10-11 NOTE — ED Notes (Signed)
RN came into room for discharge orders without knowing about the recent bleeding incident and patient wanted to discuss the bleeding with the physician.

## 2020-10-11 NOTE — ED Notes (Signed)
Patient returns from US

## 2020-10-11 NOTE — ED Notes (Addendum)
Patients daughter called me into the room. Patient was standing and clots of blood had soaked out of her double depends she was wearing. The blood was streaming down her (L) (R) legs and onto the floor.

## 2020-10-13 DIAGNOSIS — R9389 Abnormal findings on diagnostic imaging of other specified body structures: Secondary | ICD-10-CM | POA: Diagnosis not present

## 2020-10-13 DIAGNOSIS — L309 Dermatitis, unspecified: Secondary | ICD-10-CM | POA: Diagnosis not present

## 2020-10-13 LAB — GC/CHLAMYDIA PROBE AMP (~~LOC~~) NOT AT ARMC
Chlamydia: NEGATIVE
Comment: NEGATIVE
Comment: NORMAL
Neisseria Gonorrhea: NEGATIVE

## 2020-10-22 DIAGNOSIS — E119 Type 2 diabetes mellitus without complications: Secondary | ICD-10-CM | POA: Diagnosis not present

## 2020-10-22 DIAGNOSIS — I10 Essential (primary) hypertension: Secondary | ICD-10-CM | POA: Diagnosis not present

## 2020-10-22 DIAGNOSIS — Z794 Long term (current) use of insulin: Secondary | ICD-10-CM | POA: Diagnosis not present

## 2020-10-22 DIAGNOSIS — E1165 Type 2 diabetes mellitus with hyperglycemia: Secondary | ICD-10-CM | POA: Diagnosis not present

## 2020-10-22 DIAGNOSIS — D582 Other hemoglobinopathies: Secondary | ICD-10-CM | POA: Diagnosis not present

## 2020-11-10 ENCOUNTER — Encounter: Payer: Medicare Other | Admitting: Obstetrics and Gynecology

## 2020-11-26 DIAGNOSIS — E119 Type 2 diabetes mellitus without complications: Secondary | ICD-10-CM | POA: Diagnosis not present

## 2020-11-26 DIAGNOSIS — D582 Other hemoglobinopathies: Secondary | ICD-10-CM | POA: Diagnosis not present

## 2020-11-26 DIAGNOSIS — I10 Essential (primary) hypertension: Secondary | ICD-10-CM | POA: Diagnosis not present

## 2020-11-26 DIAGNOSIS — E785 Hyperlipidemia, unspecified: Secondary | ICD-10-CM | POA: Diagnosis not present

## 2020-11-26 DIAGNOSIS — E1165 Type 2 diabetes mellitus with hyperglycemia: Secondary | ICD-10-CM | POA: Diagnosis not present

## 2020-11-26 DIAGNOSIS — M159 Polyosteoarthritis, unspecified: Secondary | ICD-10-CM | POA: Diagnosis not present

## 2020-11-26 DIAGNOSIS — K219 Gastro-esophageal reflux disease without esophagitis: Secondary | ICD-10-CM | POA: Diagnosis not present

## 2020-12-18 ENCOUNTER — Encounter: Payer: Medicare Other | Admitting: Obstetrics

## 2020-12-23 DIAGNOSIS — E119 Type 2 diabetes mellitus without complications: Secondary | ICD-10-CM | POA: Diagnosis not present

## 2020-12-23 DIAGNOSIS — I10 Essential (primary) hypertension: Secondary | ICD-10-CM | POA: Diagnosis not present

## 2020-12-23 DIAGNOSIS — Z794 Long term (current) use of insulin: Secondary | ICD-10-CM | POA: Diagnosis not present

## 2020-12-23 DIAGNOSIS — E1165 Type 2 diabetes mellitus with hyperglycemia: Secondary | ICD-10-CM | POA: Diagnosis not present

## 2020-12-23 DIAGNOSIS — D582 Other hemoglobinopathies: Secondary | ICD-10-CM | POA: Diagnosis not present

## 2020-12-25 DIAGNOSIS — D582 Other hemoglobinopathies: Secondary | ICD-10-CM | POA: Diagnosis not present

## 2020-12-25 DIAGNOSIS — E119 Type 2 diabetes mellitus without complications: Secondary | ICD-10-CM | POA: Diagnosis not present

## 2020-12-25 DIAGNOSIS — I10 Essential (primary) hypertension: Secondary | ICD-10-CM | POA: Diagnosis not present

## 2020-12-25 DIAGNOSIS — K219 Gastro-esophageal reflux disease without esophagitis: Secondary | ICD-10-CM | POA: Diagnosis not present

## 2020-12-25 DIAGNOSIS — E1165 Type 2 diabetes mellitus with hyperglycemia: Secondary | ICD-10-CM | POA: Diagnosis not present

## 2021-02-25 DIAGNOSIS — E785 Hyperlipidemia, unspecified: Secondary | ICD-10-CM | POA: Diagnosis not present

## 2021-02-25 DIAGNOSIS — E119 Type 2 diabetes mellitus without complications: Secondary | ICD-10-CM | POA: Diagnosis not present

## 2021-02-25 DIAGNOSIS — M159 Polyosteoarthritis, unspecified: Secondary | ICD-10-CM | POA: Diagnosis not present

## 2021-02-25 DIAGNOSIS — K219 Gastro-esophageal reflux disease without esophagitis: Secondary | ICD-10-CM | POA: Diagnosis not present

## 2021-02-25 DIAGNOSIS — D582 Other hemoglobinopathies: Secondary | ICD-10-CM | POA: Diagnosis not present

## 2021-02-25 DIAGNOSIS — I10 Essential (primary) hypertension: Secondary | ICD-10-CM | POA: Diagnosis not present

## 2021-02-25 DIAGNOSIS — E1165 Type 2 diabetes mellitus with hyperglycemia: Secondary | ICD-10-CM | POA: Diagnosis not present

## 2021-05-27 DIAGNOSIS — I10 Essential (primary) hypertension: Secondary | ICD-10-CM | POA: Diagnosis not present

## 2021-05-27 DIAGNOSIS — Z0000 Encounter for general adult medical examination without abnormal findings: Secondary | ICD-10-CM | POA: Diagnosis not present

## 2021-05-27 DIAGNOSIS — E119 Type 2 diabetes mellitus without complications: Secondary | ICD-10-CM | POA: Diagnosis not present

## 2021-05-27 DIAGNOSIS — K219 Gastro-esophageal reflux disease without esophagitis: Secondary | ICD-10-CM | POA: Diagnosis not present

## 2021-05-27 DIAGNOSIS — R0609 Other forms of dyspnea: Secondary | ICD-10-CM | POA: Diagnosis not present

## 2021-05-27 DIAGNOSIS — E785 Hyperlipidemia, unspecified: Secondary | ICD-10-CM | POA: Diagnosis not present

## 2021-05-27 DIAGNOSIS — M159 Polyosteoarthritis, unspecified: Secondary | ICD-10-CM | POA: Diagnosis not present

## 2021-05-27 DIAGNOSIS — D582 Other hemoglobinopathies: Secondary | ICD-10-CM | POA: Diagnosis not present

## 2021-05-27 DIAGNOSIS — E1165 Type 2 diabetes mellitus with hyperglycemia: Secondary | ICD-10-CM | POA: Diagnosis not present

## 2021-05-27 DIAGNOSIS — R350 Frequency of micturition: Secondary | ICD-10-CM | POA: Diagnosis not present

## 2021-05-27 DIAGNOSIS — Z1389 Encounter for screening for other disorder: Secondary | ICD-10-CM | POA: Diagnosis not present

## 2021-05-27 DIAGNOSIS — M170 Bilateral primary osteoarthritis of knee: Secondary | ICD-10-CM | POA: Diagnosis not present

## 2021-05-27 DIAGNOSIS — Z1159 Encounter for screening for other viral diseases: Secondary | ICD-10-CM | POA: Diagnosis not present

## 2021-06-16 DIAGNOSIS — E119 Type 2 diabetes mellitus without complications: Secondary | ICD-10-CM | POA: Diagnosis not present

## 2021-06-16 DIAGNOSIS — K219 Gastro-esophageal reflux disease without esophagitis: Secondary | ICD-10-CM | POA: Diagnosis not present

## 2021-06-16 DIAGNOSIS — M170 Bilateral primary osteoarthritis of knee: Secondary | ICD-10-CM | POA: Diagnosis not present

## 2021-06-16 DIAGNOSIS — I10 Essential (primary) hypertension: Secondary | ICD-10-CM | POA: Diagnosis not present

## 2021-06-16 DIAGNOSIS — E785 Hyperlipidemia, unspecified: Secondary | ICD-10-CM | POA: Diagnosis not present

## 2021-06-16 DIAGNOSIS — E1165 Type 2 diabetes mellitus with hyperglycemia: Secondary | ICD-10-CM | POA: Diagnosis not present

## 2021-06-16 DIAGNOSIS — D582 Other hemoglobinopathies: Secondary | ICD-10-CM | POA: Diagnosis not present

## 2021-06-25 DIAGNOSIS — I10 Essential (primary) hypertension: Secondary | ICD-10-CM | POA: Diagnosis not present

## 2021-06-25 DIAGNOSIS — R69 Illness, unspecified: Secondary | ICD-10-CM | POA: Diagnosis not present

## 2021-06-25 DIAGNOSIS — Z794 Long term (current) use of insulin: Secondary | ICD-10-CM | POA: Diagnosis not present

## 2021-06-25 DIAGNOSIS — D582 Other hemoglobinopathies: Secondary | ICD-10-CM | POA: Diagnosis not present

## 2021-06-25 DIAGNOSIS — E119 Type 2 diabetes mellitus without complications: Secondary | ICD-10-CM | POA: Diagnosis not present

## 2021-08-24 DIAGNOSIS — E785 Hyperlipidemia, unspecified: Secondary | ICD-10-CM | POA: Diagnosis not present

## 2021-08-24 DIAGNOSIS — E1165 Type 2 diabetes mellitus with hyperglycemia: Secondary | ICD-10-CM | POA: Diagnosis not present

## 2021-08-24 DIAGNOSIS — I10 Essential (primary) hypertension: Secondary | ICD-10-CM | POA: Diagnosis not present

## 2021-09-04 DIAGNOSIS — R0609 Other forms of dyspnea: Secondary | ICD-10-CM | POA: Diagnosis not present

## 2021-10-20 DIAGNOSIS — E119 Type 2 diabetes mellitus without complications: Secondary | ICD-10-CM | POA: Diagnosis not present

## 2021-10-20 DIAGNOSIS — H401132 Primary open-angle glaucoma, bilateral, moderate stage: Secondary | ICD-10-CM | POA: Diagnosis not present

## 2021-11-09 DIAGNOSIS — H401132 Primary open-angle glaucoma, bilateral, moderate stage: Secondary | ICD-10-CM | POA: Diagnosis not present

## 2021-11-18 DIAGNOSIS — H5213 Myopia, bilateral: Secondary | ICD-10-CM | POA: Diagnosis not present

## 2021-11-20 DIAGNOSIS — E785 Hyperlipidemia, unspecified: Secondary | ICD-10-CM | POA: Diagnosis not present

## 2021-11-20 DIAGNOSIS — M170 Bilateral primary osteoarthritis of knee: Secondary | ICD-10-CM | POA: Diagnosis not present

## 2021-11-20 DIAGNOSIS — I10 Essential (primary) hypertension: Secondary | ICD-10-CM | POA: Diagnosis not present

## 2021-11-20 DIAGNOSIS — E1165 Type 2 diabetes mellitus with hyperglycemia: Secondary | ICD-10-CM | POA: Diagnosis not present

## 2021-11-20 DIAGNOSIS — K219 Gastro-esophageal reflux disease without esophagitis: Secondary | ICD-10-CM | POA: Diagnosis not present

## 2021-11-23 DIAGNOSIS — H401132 Primary open-angle glaucoma, bilateral, moderate stage: Secondary | ICD-10-CM | POA: Diagnosis not present

## 2021-12-22 DIAGNOSIS — H524 Presbyopia: Secondary | ICD-10-CM | POA: Diagnosis not present

## 2022-02-16 DIAGNOSIS — E785 Hyperlipidemia, unspecified: Secondary | ICD-10-CM | POA: Diagnosis not present

## 2022-02-16 DIAGNOSIS — E1165 Type 2 diabetes mellitus with hyperglycemia: Secondary | ICD-10-CM | POA: Diagnosis not present

## 2022-02-16 DIAGNOSIS — I10 Essential (primary) hypertension: Secondary | ICD-10-CM | POA: Diagnosis not present

## 2022-02-16 DIAGNOSIS — K219 Gastro-esophageal reflux disease without esophagitis: Secondary | ICD-10-CM | POA: Diagnosis not present

## 2022-03-02 DIAGNOSIS — I10 Essential (primary) hypertension: Secondary | ICD-10-CM | POA: Diagnosis not present

## 2022-03-02 DIAGNOSIS — K219 Gastro-esophageal reflux disease without esophagitis: Secondary | ICD-10-CM | POA: Diagnosis not present

## 2022-03-02 DIAGNOSIS — R21 Rash and other nonspecific skin eruption: Secondary | ICD-10-CM | POA: Diagnosis not present

## 2022-03-02 DIAGNOSIS — E1165 Type 2 diabetes mellitus with hyperglycemia: Secondary | ICD-10-CM | POA: Diagnosis not present

## 2022-03-02 DIAGNOSIS — E785 Hyperlipidemia, unspecified: Secondary | ICD-10-CM | POA: Diagnosis not present

## 2022-03-02 DIAGNOSIS — D582 Other hemoglobinopathies: Secondary | ICD-10-CM | POA: Diagnosis not present

## 2022-04-16 DIAGNOSIS — H401132 Primary open-angle glaucoma, bilateral, moderate stage: Secondary | ICD-10-CM | POA: Diagnosis not present

## 2022-05-04 DIAGNOSIS — K219 Gastro-esophageal reflux disease without esophagitis: Secondary | ICD-10-CM | POA: Diagnosis not present

## 2022-05-04 DIAGNOSIS — I10 Essential (primary) hypertension: Secondary | ICD-10-CM | POA: Diagnosis not present

## 2022-05-04 DIAGNOSIS — E785 Hyperlipidemia, unspecified: Secondary | ICD-10-CM | POA: Diagnosis not present

## 2022-05-04 DIAGNOSIS — E1165 Type 2 diabetes mellitus with hyperglycemia: Secondary | ICD-10-CM | POA: Diagnosis not present

## 2022-05-18 DIAGNOSIS — K219 Gastro-esophageal reflux disease without esophagitis: Secondary | ICD-10-CM | POA: Diagnosis not present

## 2022-05-18 DIAGNOSIS — E1165 Type 2 diabetes mellitus with hyperglycemia: Secondary | ICD-10-CM | POA: Diagnosis not present

## 2022-05-18 DIAGNOSIS — E785 Hyperlipidemia, unspecified: Secondary | ICD-10-CM | POA: Diagnosis not present

## 2022-05-18 DIAGNOSIS — I10 Essential (primary) hypertension: Secondary | ICD-10-CM | POA: Diagnosis not present

## 2022-06-09 DIAGNOSIS — I890 Lymphedema, not elsewhere classified: Secondary | ICD-10-CM | POA: Diagnosis not present

## 2022-06-09 DIAGNOSIS — I872 Venous insufficiency (chronic) (peripheral): Secondary | ICD-10-CM | POA: Diagnosis not present

## 2022-06-16 DIAGNOSIS — E785 Hyperlipidemia, unspecified: Secondary | ICD-10-CM | POA: Diagnosis not present

## 2022-06-16 DIAGNOSIS — I10 Essential (primary) hypertension: Secondary | ICD-10-CM | POA: Diagnosis not present

## 2022-06-16 DIAGNOSIS — E1165 Type 2 diabetes mellitus with hyperglycemia: Secondary | ICD-10-CM | POA: Diagnosis not present

## 2022-06-16 DIAGNOSIS — K219 Gastro-esophageal reflux disease without esophagitis: Secondary | ICD-10-CM | POA: Diagnosis not present

## 2022-06-30 DIAGNOSIS — R718 Other abnormality of red blood cells: Secondary | ICD-10-CM | POA: Diagnosis not present

## 2022-06-30 DIAGNOSIS — R944 Abnormal results of kidney function studies: Secondary | ICD-10-CM | POA: Diagnosis not present

## 2022-06-30 DIAGNOSIS — I10 Essential (primary) hypertension: Secondary | ICD-10-CM | POA: Diagnosis not present

## 2022-06-30 DIAGNOSIS — E1165 Type 2 diabetes mellitus with hyperglycemia: Secondary | ICD-10-CM | POA: Diagnosis not present

## 2022-06-30 DIAGNOSIS — M797 Fibromyalgia: Secondary | ICD-10-CM | POA: Diagnosis not present

## 2022-06-30 DIAGNOSIS — Z0000 Encounter for general adult medical examination without abnormal findings: Secondary | ICD-10-CM | POA: Diagnosis not present

## 2022-06-30 DIAGNOSIS — D582 Other hemoglobinopathies: Secondary | ICD-10-CM | POA: Diagnosis not present

## 2022-06-30 DIAGNOSIS — Z23 Encounter for immunization: Secondary | ICD-10-CM | POA: Diagnosis not present

## 2022-06-30 DIAGNOSIS — Z794 Long term (current) use of insulin: Secondary | ICD-10-CM | POA: Diagnosis not present

## 2022-06-30 DIAGNOSIS — M170 Bilateral primary osteoarthritis of knee: Secondary | ICD-10-CM | POA: Diagnosis not present

## 2022-06-30 DIAGNOSIS — E785 Hyperlipidemia, unspecified: Secondary | ICD-10-CM | POA: Diagnosis not present

## 2022-06-30 DIAGNOSIS — Z1329 Encounter for screening for other suspected endocrine disorder: Secondary | ICD-10-CM | POA: Diagnosis not present

## 2022-06-30 DIAGNOSIS — N3281 Overactive bladder: Secondary | ICD-10-CM | POA: Diagnosis not present

## 2022-07-07 DIAGNOSIS — E876 Hypokalemia: Secondary | ICD-10-CM | POA: Diagnosis not present

## 2022-07-09 DIAGNOSIS — Z794 Long term (current) use of insulin: Secondary | ICD-10-CM | POA: Diagnosis not present

## 2022-07-09 DIAGNOSIS — E119 Type 2 diabetes mellitus without complications: Secondary | ICD-10-CM | POA: Diagnosis not present

## 2022-07-09 DIAGNOSIS — D582 Other hemoglobinopathies: Secondary | ICD-10-CM | POA: Diagnosis not present

## 2022-07-09 DIAGNOSIS — I10 Essential (primary) hypertension: Secondary | ICD-10-CM | POA: Diagnosis not present

## 2022-08-16 DIAGNOSIS — H401132 Primary open-angle glaucoma, bilateral, moderate stage: Secondary | ICD-10-CM | POA: Diagnosis not present

## 2022-10-18 DIAGNOSIS — H401132 Primary open-angle glaucoma, bilateral, moderate stage: Secondary | ICD-10-CM | POA: Diagnosis not present

## 2022-10-25 DIAGNOSIS — E119 Type 2 diabetes mellitus without complications: Secondary | ICD-10-CM | POA: Diagnosis not present

## 2022-10-25 DIAGNOSIS — Z794 Long term (current) use of insulin: Secondary | ICD-10-CM | POA: Diagnosis not present

## 2022-10-25 DIAGNOSIS — I10 Essential (primary) hypertension: Secondary | ICD-10-CM | POA: Diagnosis not present

## 2022-10-25 DIAGNOSIS — R5383 Other fatigue: Secondary | ICD-10-CM | POA: Diagnosis not present

## 2022-10-25 DIAGNOSIS — D582 Other hemoglobinopathies: Secondary | ICD-10-CM | POA: Diagnosis not present

## 2023-01-10 DIAGNOSIS — N3281 Overactive bladder: Secondary | ICD-10-CM | POA: Diagnosis not present

## 2023-01-10 DIAGNOSIS — K219 Gastro-esophageal reflux disease without esophagitis: Secondary | ICD-10-CM | POA: Diagnosis not present

## 2023-01-10 DIAGNOSIS — N1831 Chronic kidney disease, stage 3a: Secondary | ICD-10-CM | POA: Diagnosis not present

## 2023-01-10 DIAGNOSIS — I10 Essential (primary) hypertension: Secondary | ICD-10-CM | POA: Diagnosis not present

## 2023-01-10 DIAGNOSIS — I890 Lymphedema, not elsewhere classified: Secondary | ICD-10-CM | POA: Diagnosis not present

## 2023-01-10 DIAGNOSIS — I872 Venous insufficiency (chronic) (peripheral): Secondary | ICD-10-CM | POA: Diagnosis not present

## 2023-01-10 DIAGNOSIS — D582 Other hemoglobinopathies: Secondary | ICD-10-CM | POA: Diagnosis not present

## 2023-01-10 DIAGNOSIS — E1165 Type 2 diabetes mellitus with hyperglycemia: Secondary | ICD-10-CM | POA: Diagnosis not present

## 2023-01-10 DIAGNOSIS — E1122 Type 2 diabetes mellitus with diabetic chronic kidney disease: Secondary | ICD-10-CM | POA: Diagnosis not present

## 2023-01-10 DIAGNOSIS — E785 Hyperlipidemia, unspecified: Secondary | ICD-10-CM | POA: Diagnosis not present

## 2023-01-22 IMAGING — US US PELVIS COMPLETE WITH TRANSVAGINAL
1 series · 13 of 25 positions shown · non-contrast
Comparison: November 22, 2007

CLINICAL DATA: Vaginal bleeding.



[Series 1: us pelvic complete with transvaginal · 13 of 100 slices shown]
[im 1/100]
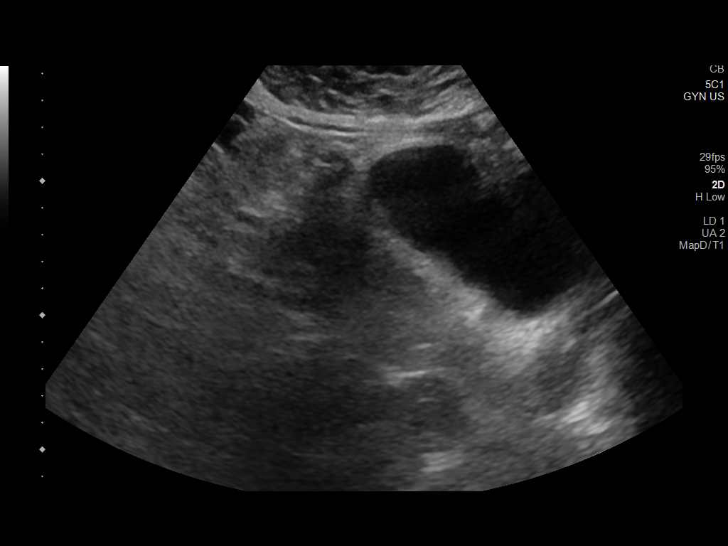
[im 9/100]
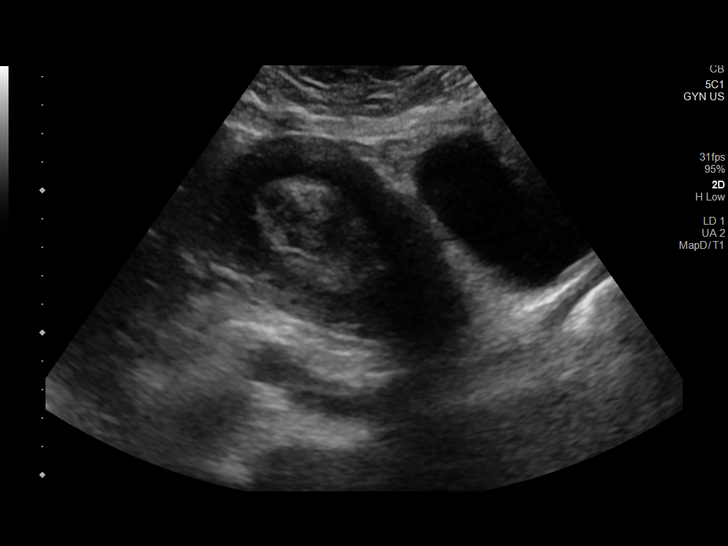
[im 17/100]
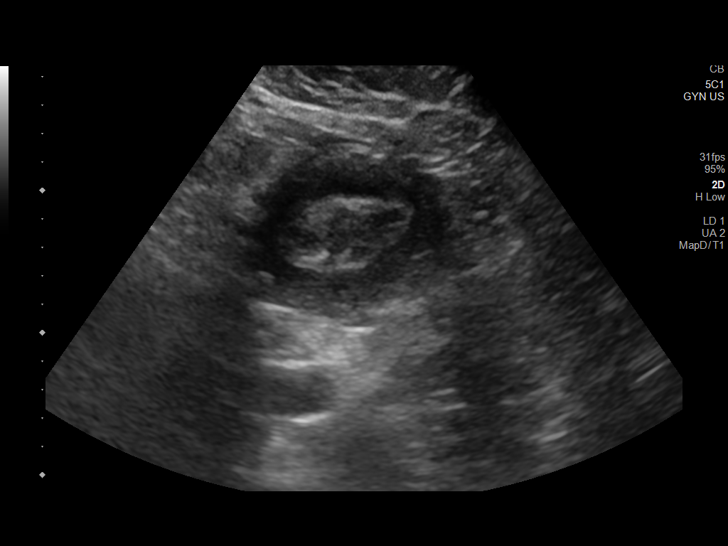
[im 25/100]
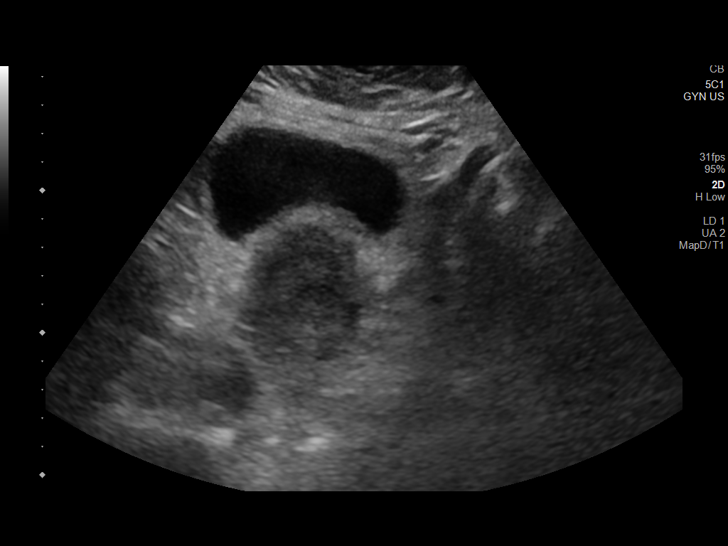
[im 34/100]
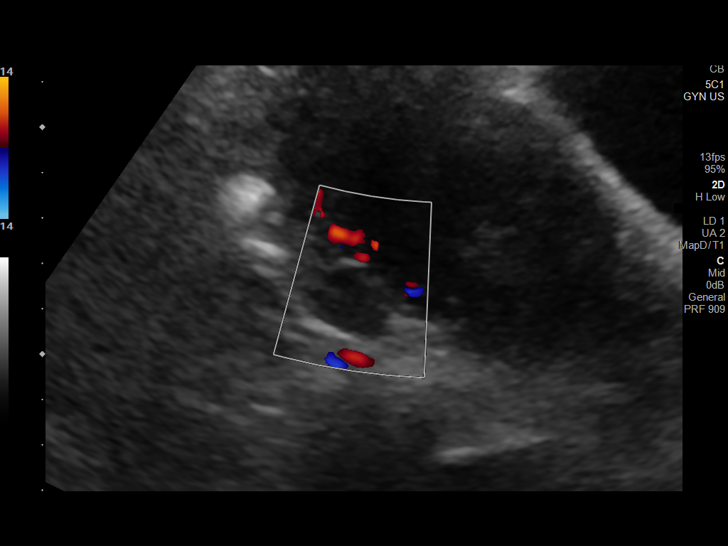
[im 42/100]
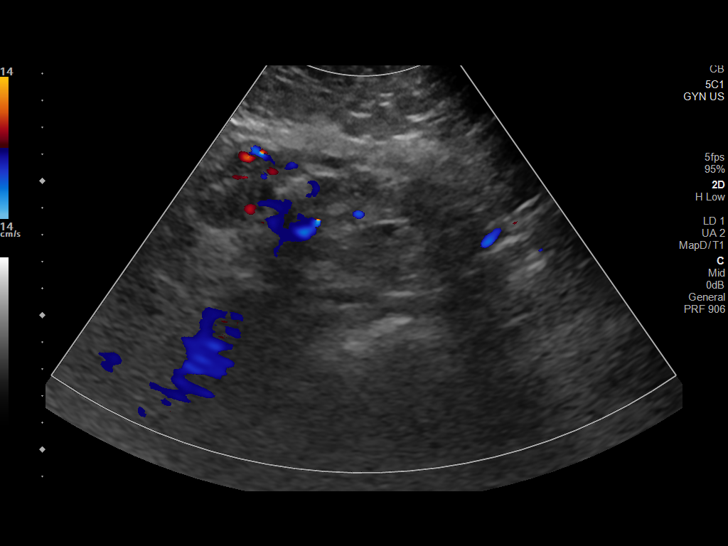
[im 50/100]
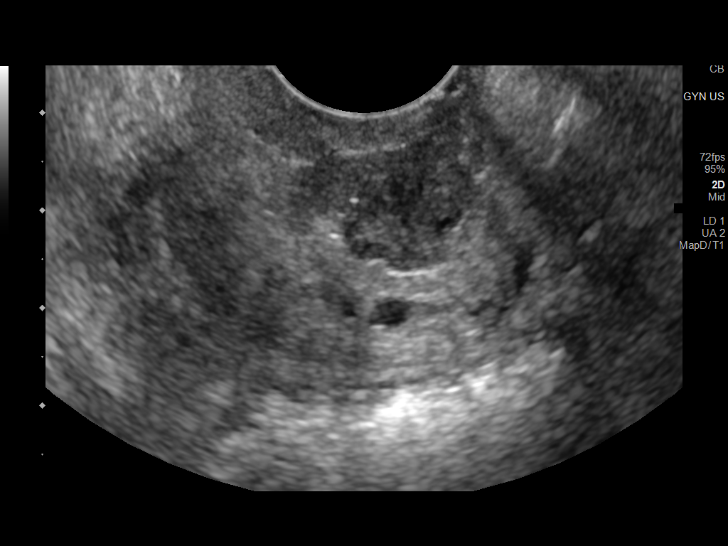
[im 58/100]
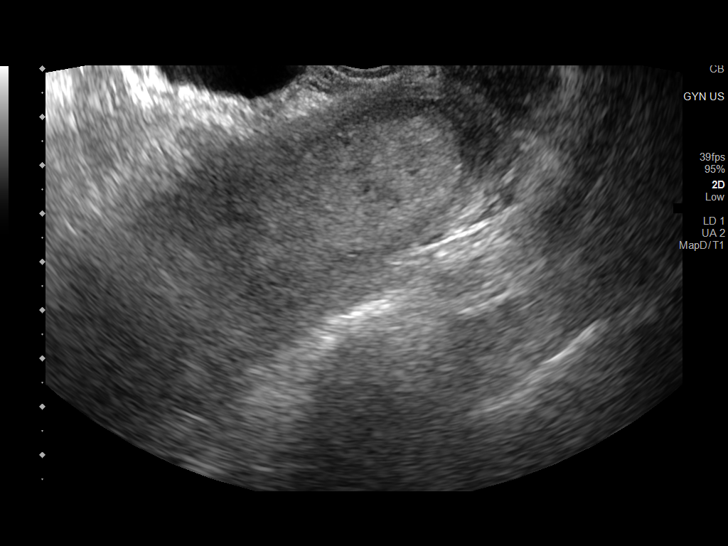
[im 67/100]
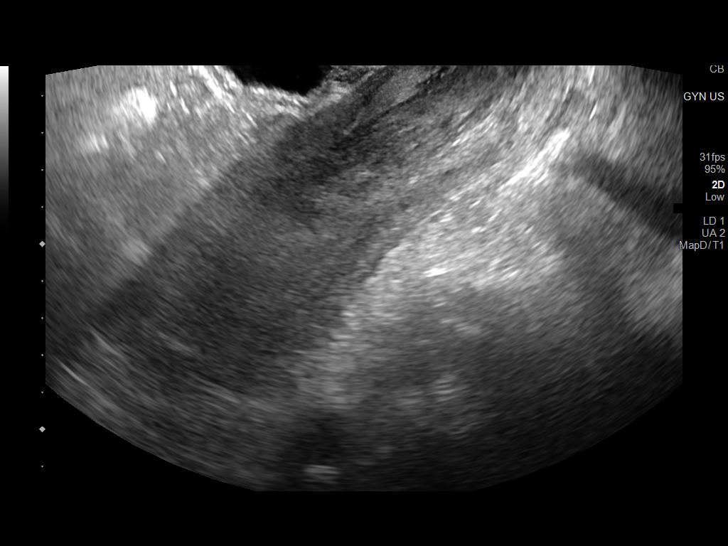
[im 75/100]
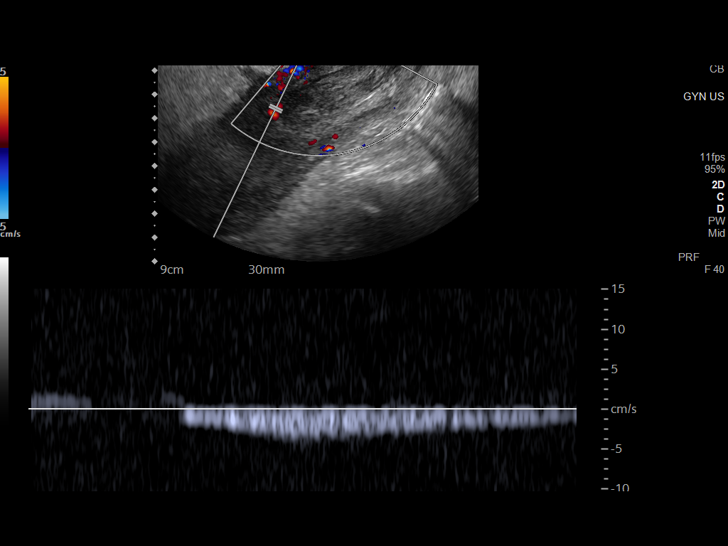
[im 83/100]
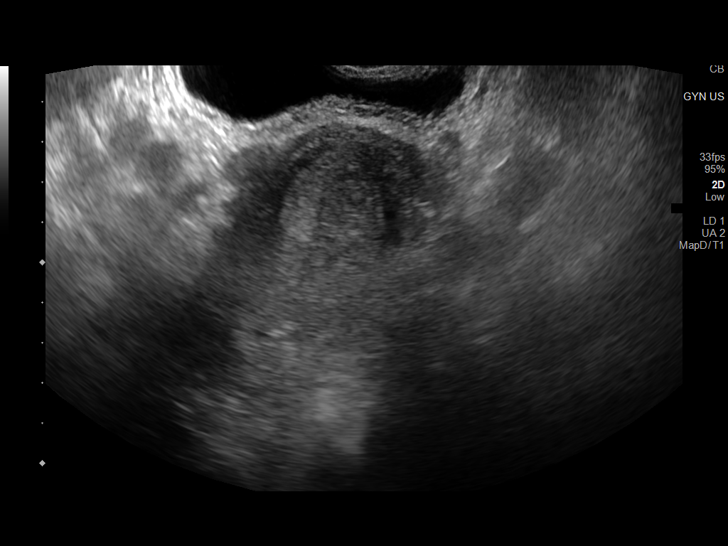
[im 91/100]
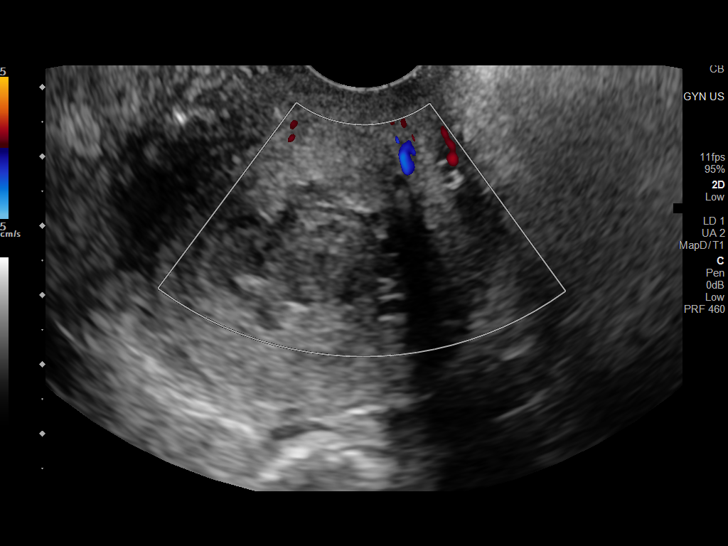
[im 100/100]
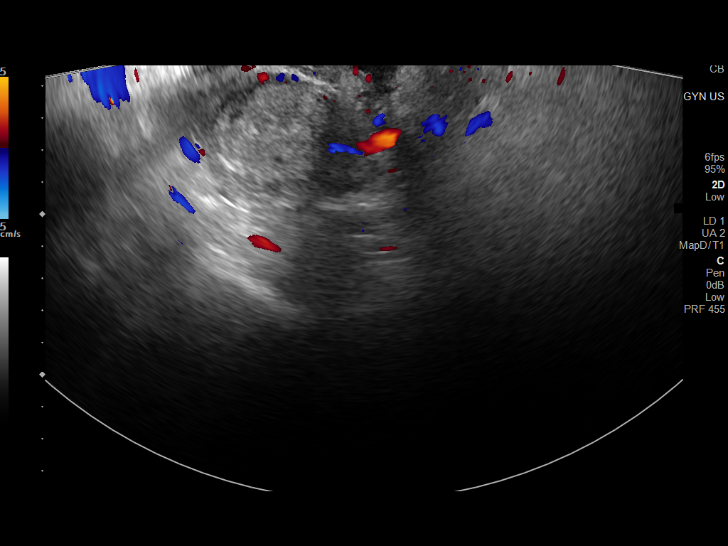

[13 of 25 positions shown; findings below may reference images not displayed]

FINDINGS: Uterus

Measurements: 15.4 cm x 5.8 cm x 6.3 cm = volume: 295.4 mL. No
fibroids or other mass visualized.

An ill-defined area of heterogeneous hypoechogenicity is seen within
the region of the cervix (ultrasonographic measurements not
provided). No abnormal flow is seen within this area on color
Doppler evaluation. This represents a new finding compared to the
prior exam.

Endometrium

Thickness: 35.3 mm.  Heterogeneous in appearance

Right ovary

The right ovary is not clearly visualized.

Left ovary

The left ovary is not clearly visualized.

Other findings

A trace amount of pelvic free fluid is seen.

It should be noted that the study is limited secondary to the
patient's body habitus and patient discomfort.
IMPRESSION: 1. Limited study without visualization of the right or left ovary.
2. Heterogeneous thickened endometrium.
3. Hypoechoic area within the cervix, as described above, which
represents a new finding when compared to the prior study. MRI
correlation is recommended, as a cervical mass cannot be excluded.

## 2023-01-27 DIAGNOSIS — D582 Other hemoglobinopathies: Secondary | ICD-10-CM | POA: Diagnosis not present

## 2023-01-27 DIAGNOSIS — I10 Essential (primary) hypertension: Secondary | ICD-10-CM | POA: Diagnosis not present

## 2023-01-27 DIAGNOSIS — Z794 Long term (current) use of insulin: Secondary | ICD-10-CM | POA: Diagnosis not present

## 2023-01-27 DIAGNOSIS — E119 Type 2 diabetes mellitus without complications: Secondary | ICD-10-CM | POA: Diagnosis not present

## 2023-02-18 DIAGNOSIS — H401132 Primary open-angle glaucoma, bilateral, moderate stage: Secondary | ICD-10-CM | POA: Diagnosis not present

## 2023-04-29 DIAGNOSIS — D582 Other hemoglobinopathies: Secondary | ICD-10-CM | POA: Diagnosis not present

## 2023-04-29 DIAGNOSIS — E119 Type 2 diabetes mellitus without complications: Secondary | ICD-10-CM | POA: Diagnosis not present

## 2023-04-29 DIAGNOSIS — I10 Essential (primary) hypertension: Secondary | ICD-10-CM | POA: Diagnosis not present

## 2023-04-29 DIAGNOSIS — Z794 Long term (current) use of insulin: Secondary | ICD-10-CM | POA: Diagnosis not present

## 2023-08-01 DIAGNOSIS — D582 Other hemoglobinopathies: Secondary | ICD-10-CM | POA: Diagnosis not present

## 2023-08-01 DIAGNOSIS — L299 Pruritus, unspecified: Secondary | ICD-10-CM | POA: Diagnosis not present

## 2023-08-01 DIAGNOSIS — I10 Essential (primary) hypertension: Secondary | ICD-10-CM | POA: Diagnosis not present

## 2023-08-01 DIAGNOSIS — E119 Type 2 diabetes mellitus without complications: Secondary | ICD-10-CM | POA: Diagnosis not present

## 2023-08-01 DIAGNOSIS — Z794 Long term (current) use of insulin: Secondary | ICD-10-CM | POA: Diagnosis not present

## 2023-10-24 DIAGNOSIS — L299 Pruritus, unspecified: Secondary | ICD-10-CM | POA: Diagnosis not present

## 2023-10-24 DIAGNOSIS — I10 Essential (primary) hypertension: Secondary | ICD-10-CM | POA: Diagnosis not present

## 2023-10-24 DIAGNOSIS — Z794 Long term (current) use of insulin: Secondary | ICD-10-CM | POA: Diagnosis not present

## 2023-10-24 DIAGNOSIS — D582 Other hemoglobinopathies: Secondary | ICD-10-CM | POA: Diagnosis not present

## 2023-10-24 DIAGNOSIS — E119 Type 2 diabetes mellitus without complications: Secondary | ICD-10-CM | POA: Diagnosis not present

## 2023-11-07 DIAGNOSIS — E119 Type 2 diabetes mellitus without complications: Secondary | ICD-10-CM | POA: Diagnosis not present

## 2023-12-09 DIAGNOSIS — E119 Type 2 diabetes mellitus without complications: Secondary | ICD-10-CM | POA: Diagnosis not present

## 2024-02-09 DIAGNOSIS — Z794 Long term (current) use of insulin: Secondary | ICD-10-CM | POA: Diagnosis not present

## 2024-02-09 DIAGNOSIS — E119 Type 2 diabetes mellitus without complications: Secondary | ICD-10-CM | POA: Diagnosis not present

## 2024-02-09 DIAGNOSIS — D582 Other hemoglobinopathies: Secondary | ICD-10-CM | POA: Diagnosis not present

## 2024-02-09 DIAGNOSIS — I10 Essential (primary) hypertension: Secondary | ICD-10-CM | POA: Diagnosis not present

## 2024-03-06 DIAGNOSIS — Z794 Long term (current) use of insulin: Secondary | ICD-10-CM | POA: Diagnosis not present

## 2024-03-06 DIAGNOSIS — E1165 Type 2 diabetes mellitus with hyperglycemia: Secondary | ICD-10-CM | POA: Diagnosis not present

## 2024-03-06 DIAGNOSIS — I10 Essential (primary) hypertension: Secondary | ICD-10-CM | POA: Diagnosis not present

## 2024-03-06 DIAGNOSIS — E785 Hyperlipidemia, unspecified: Secondary | ICD-10-CM | POA: Diagnosis not present

## 2024-03-06 DIAGNOSIS — N1831 Chronic kidney disease, stage 3a: Secondary | ICD-10-CM | POA: Diagnosis not present

## 2024-03-06 DIAGNOSIS — B349 Viral infection, unspecified: Secondary | ICD-10-CM | POA: Diagnosis not present

## 2024-03-06 DIAGNOSIS — N3281 Overactive bladder: Secondary | ICD-10-CM | POA: Diagnosis not present

## 2024-03-12 DIAGNOSIS — E119 Type 2 diabetes mellitus without complications: Secondary | ICD-10-CM | POA: Diagnosis not present
